# Patient Record
Sex: Female | Born: 1955 | Race: White | Marital: Married
Health system: Southern US, Community
[De-identification: ages and names within clinical notes are randomized; demographics above are authoritative.]

---

## 2013-11-10 DIAGNOSIS — Z90710 Acquired absence of both cervix and uterus: Secondary | ICD-10-CM | POA: Insufficient documentation

## 2014-01-04 DIAGNOSIS — I10 Essential (primary) hypertension: Secondary | ICD-10-CM | POA: Insufficient documentation

## 2014-01-23 DIAGNOSIS — K635 Polyp of colon: Secondary | ICD-10-CM | POA: Insufficient documentation

## 2014-04-11 DIAGNOSIS — F419 Anxiety disorder, unspecified: Secondary | ICD-10-CM | POA: Insufficient documentation

## 2014-04-11 DIAGNOSIS — K649 Unspecified hemorrhoids: Secondary | ICD-10-CM | POA: Insufficient documentation

## 2014-11-06 DIAGNOSIS — K5901 Slow transit constipation: Secondary | ICD-10-CM | POA: Insufficient documentation

## 2014-11-06 DIAGNOSIS — K219 Gastro-esophageal reflux disease without esophagitis: Secondary | ICD-10-CM | POA: Insufficient documentation

## 2015-06-18 DIAGNOSIS — M2550 Pain in unspecified joint: Secondary | ICD-10-CM | POA: Insufficient documentation

## 2015-06-18 DIAGNOSIS — M25469 Effusion, unspecified knee: Secondary | ICD-10-CM | POA: Insufficient documentation

## 2015-06-18 DIAGNOSIS — M25521 Pain in right elbow: Secondary | ICD-10-CM | POA: Insufficient documentation

## 2015-06-18 DIAGNOSIS — R5383 Other fatigue: Secondary | ICD-10-CM | POA: Insufficient documentation

## 2015-06-18 DIAGNOSIS — Z9889 Other specified postprocedural states: Secondary | ICD-10-CM | POA: Insufficient documentation

## 2015-06-18 DIAGNOSIS — M25541 Pain in joints of right hand: Secondary | ICD-10-CM | POA: Insufficient documentation

## 2015-07-02 DIAGNOSIS — M150 Primary generalized (osteo)arthritis: Secondary | ICD-10-CM | POA: Insufficient documentation

## 2015-07-02 DIAGNOSIS — M159 Polyosteoarthritis, unspecified: Secondary | ICD-10-CM | POA: Insufficient documentation

## 2015-07-02 DIAGNOSIS — M810 Age-related osteoporosis without current pathological fracture: Secondary | ICD-10-CM | POA: Insufficient documentation

## 2015-07-02 DIAGNOSIS — M4120 Other idiopathic scoliosis, site unspecified: Secondary | ICD-10-CM | POA: Insufficient documentation

## 2015-11-05 DIAGNOSIS — E785 Hyperlipidemia, unspecified: Secondary | ICD-10-CM | POA: Insufficient documentation

## 2015-12-18 DIAGNOSIS — R197 Diarrhea, unspecified: Secondary | ICD-10-CM | POA: Insufficient documentation

## 2016-01-01 ENCOUNTER — Other Ambulatory Visit: Payer: Self-pay | Admitting: Orthopaedic Surgery

## 2016-01-01 DIAGNOSIS — M545 Low back pain: Principal | ICD-10-CM

## 2016-01-01 DIAGNOSIS — G8929 Other chronic pain: Secondary | ICD-10-CM

## 2016-01-07 ENCOUNTER — Ambulatory Visit
Admission: RE | Admit: 2016-01-07 | Discharge: 2016-01-07 | Disposition: A | Discharge status: Home / Self Care | Payer: Medicare Other | Source: Ambulatory Visit | Attending: Orthopaedic Surgery | Admitting: Orthopaedic Surgery

## 2016-01-07 DIAGNOSIS — G8929 Other chronic pain: Secondary | ICD-10-CM

## 2016-01-07 DIAGNOSIS — M545 Low back pain: Principal | ICD-10-CM

## 2016-01-07 MED ORDER — OXYCODONE-ACETAMINOPHEN 5-325 MG PO TABS
2.0000 | ORAL_TABLET | Freq: Once | ORAL | Status: AC
Start: 2016-01-07 — End: 2016-01-07
  Administered 2016-01-07: 2 via ORAL

## 2016-01-07 MED ORDER — DIAZEPAM 5 MG PO TABS
10.0000 mg | ORAL_TABLET | Freq: Once | ORAL | Status: AC
  Administered 2016-01-07: 10 mg via ORAL

## 2016-01-07 MED ORDER — MEPERIDINE HCL 100 MG/ML IJ SOLN
75.0000 mg | Freq: Once | INTRAMUSCULAR | Status: AC
  Administered 2016-01-07: 75 mg via INTRAMUSCULAR

## 2016-01-07 MED ORDER — IOPAMIDOL (ISOVUE-M 200) INJECTION 41%
15.0000 mL | Freq: Once | INTRAMUSCULAR | Status: AC
  Administered 2016-01-07: 15 mL via INTRATHECAL

## 2016-01-07 MED ORDER — ONDANSETRON HCL 4 MG/2ML IJ SOLN
4.0000 mg | Freq: Once | INTRAMUSCULAR | Status: AC
  Administered 2016-01-07: 4 mg via INTRAMUSCULAR

## 2016-01-07 NOTE — Progress Notes (Signed)
Pt states she has been off Paxil for the past 2 days. 

## 2016-01-07 NOTE — Discharge Instructions (Signed)
Myelogram Discharge Instructions  1. Go home and rest quietly for the next 24 hours.  It is important to lie flat for the next 24 hours.  Get up only to go to the restroom.  You may lie in the bed or on a couch on your back, your stomach, your left side or your right side.  You may have one pillow under your head.  You may have pillows between your knees while you are on your side or under your knees while you are on your back.  2. DO NOT drive today.  Recline the seat as far back as it will go, while still wearing your seat belt, on the way home.  3. You may get up to go to the bathroom as needed.  You may sit up for 10 minutes to eat.  You may resume your normal diet and medications unless otherwise indicated.  Drink lots of extra fluids today and tomorrow.  4. The incidence of headache, nausea, or vomiting is about 5% (one in 20 patients).  If you develop a headache, lie flat and drink plenty of fluids until the headache goes away.  Caffeinated beverages may be helpful.  If you develop severe nausea and vomiting or a headache that does not go away with flat bed rest, call [phone].  5. You may resume normal activities after your 24 hours of bed rest is over; however, do not exert yourself strongly or do any heavy lifting tomorrow. If when you get up you have a headache when standing, go back to bed and force fluids for another 24 hours.  6. Call your physician for a follow-up appointment.  The results of your myelogram will be sent directly to your physician by the following day.  7. If you have any questions or if complications develop after you arrive home, please call [phone].  Discharge instructions have been explained to the patient.  The patient, or the person responsible for the patient, fully understands these instructions.       May resume Paxil on Jan 08, 2016, after 9:30 am.

## 2016-04-27 ENCOUNTER — Other Ambulatory Visit: Payer: Self-pay | Admitting: Orthopaedic Surgery

## 2016-04-27 DIAGNOSIS — M898X1 Other specified disorders of bone, shoulder: Secondary | ICD-10-CM

## 2016-04-29 ENCOUNTER — Other Ambulatory Visit: Payer: Self-pay | Admitting: Orthopaedic Surgery

## 2016-04-29 DIAGNOSIS — M8580 Other specified disorders of bone density and structure, unspecified site: Secondary | ICD-10-CM

## 2016-05-08 ENCOUNTER — Other Ambulatory Visit: Payer: Medicare Other

## 2016-05-13 DIAGNOSIS — N62 Hypertrophy of breast: Secondary | ICD-10-CM | POA: Insufficient documentation

## 2016-05-13 DIAGNOSIS — M545 Low back pain, unspecified: Secondary | ICD-10-CM | POA: Insufficient documentation

## 2016-05-13 DIAGNOSIS — N6489 Other specified disorders of breast: Secondary | ICD-10-CM | POA: Insufficient documentation

## 2016-06-12 DIAGNOSIS — K5732 Diverticulitis of large intestine without perforation or abscess without bleeding: Secondary | ICD-10-CM | POA: Insufficient documentation

## 2016-07-15 DIAGNOSIS — Z79899 Other long term (current) drug therapy: Secondary | ICD-10-CM | POA: Insufficient documentation

## 2016-07-21 DIAGNOSIS — E7800 Pure hypercholesterolemia, unspecified: Secondary | ICD-10-CM | POA: Insufficient documentation

## 2016-07-21 DIAGNOSIS — R32 Unspecified urinary incontinence: Secondary | ICD-10-CM | POA: Insufficient documentation

## 2016-08-18 DIAGNOSIS — E559 Vitamin D deficiency, unspecified: Secondary | ICD-10-CM | POA: Insufficient documentation

## 2016-09-15 DIAGNOSIS — R0981 Nasal congestion: Secondary | ICD-10-CM | POA: Insufficient documentation

## 2017-08-02 DIAGNOSIS — R634 Abnormal weight loss: Secondary | ICD-10-CM | POA: Insufficient documentation

## 2018-08-25 ENCOUNTER — Other Ambulatory Visit: Payer: Self-pay | Admitting: Orthopaedic Surgery

## 2018-08-25 DIAGNOSIS — M1612 Unilateral primary osteoarthritis, left hip: Secondary | ICD-10-CM

## 2018-09-08 ENCOUNTER — Ambulatory Visit
Admission: RE | Admit: 2018-09-08 | Discharge: 2018-09-08 | Disposition: A | Discharge status: Home / Self Care | Payer: Medicare Other | Source: Ambulatory Visit | Attending: Orthopaedic Surgery | Admitting: Orthopaedic Surgery

## 2018-09-08 DIAGNOSIS — M1612 Unilateral primary osteoarthritis, left hip: Secondary | ICD-10-CM

## 2018-12-15 DIAGNOSIS — Z01818 Encounter for other preprocedural examination: Secondary | ICD-10-CM | POA: Insufficient documentation

## 2018-12-15 DIAGNOSIS — M25552 Pain in left hip: Secondary | ICD-10-CM | POA: Insufficient documentation

## 2019-10-24 DIAGNOSIS — B070 Plantar wart: Secondary | ICD-10-CM | POA: Insufficient documentation

## 2019-11-14 IMAGING — MR MR HIP*L* W/O CM
5 of 6 series · 33 of 40 positions shown · non-contrast
Comparison: None.

CLINICAL DATA: Left hip pain for 3 months.  No known injury.

EXAM:
MR OF THE LEFT HIP WITHOUT CONTRAST
TECHNIQUE: Multiplanar, multisequence MR imaging was performed. No intravenous
contrast was administered.

[Series 5: T2 fat-sat · coronal · 4.0mm · 0.74mm/px · 7 of 24 slices shown (1 of 2)]
[im 1/24]
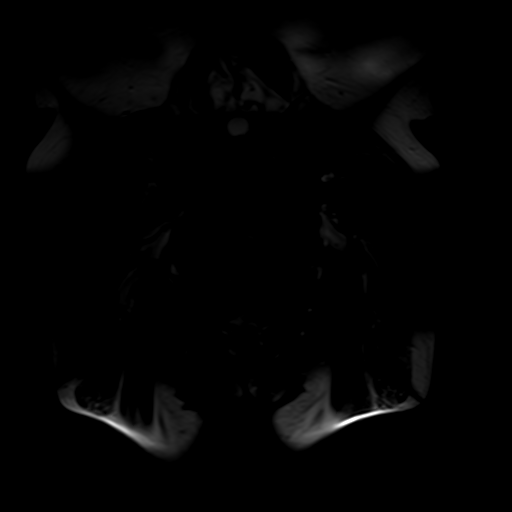
[im 4/24]
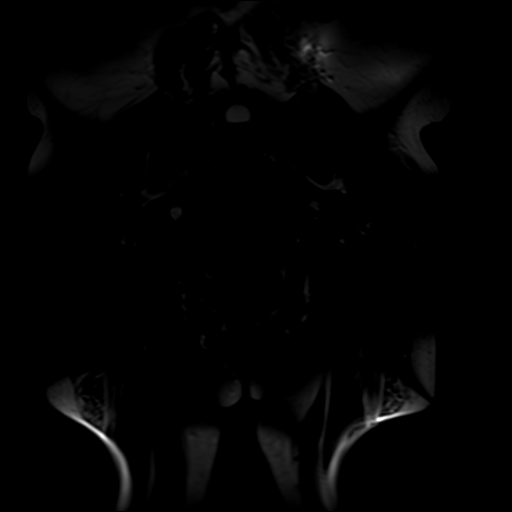
[im 8/24]
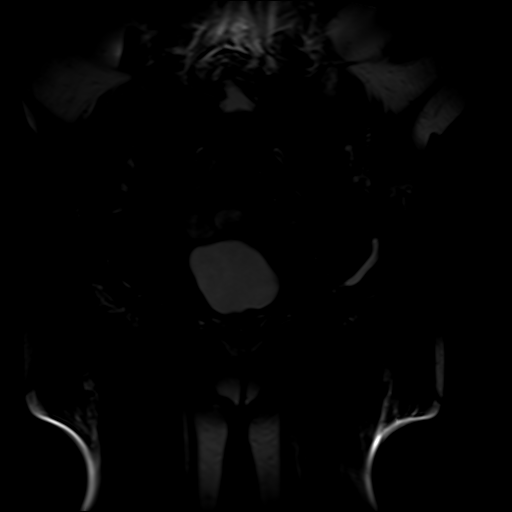
[im 12/24]
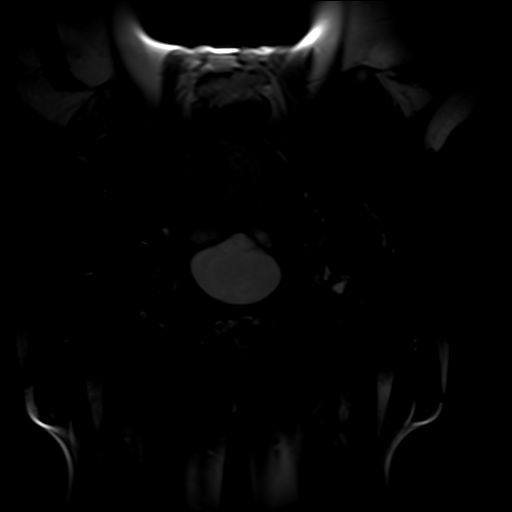
[im 16/24]
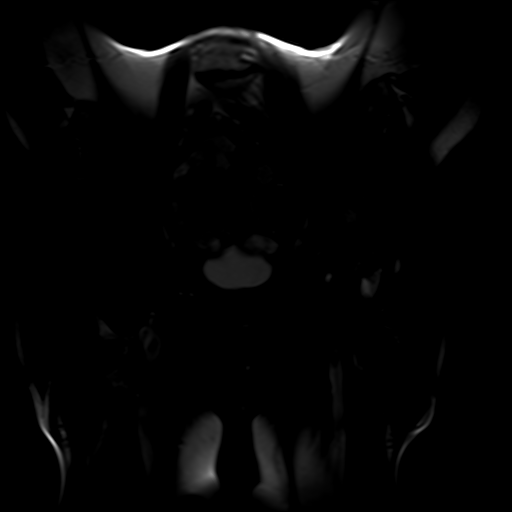
[im 20/24]
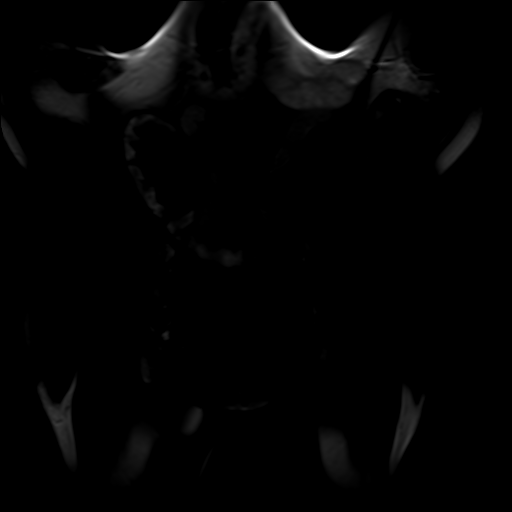
[im 24/24]
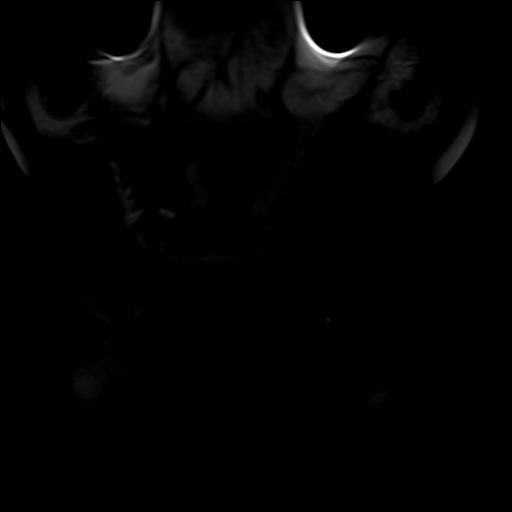

[Series 7: PD fat-sat · sagittal · 4.0mm · 0.70mm/px · 7 of 22 slices shown (1 of 3)]
[im 1/22]
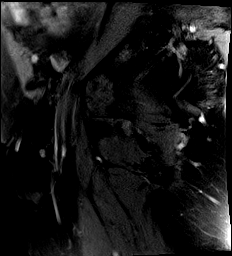
[im 4/22]
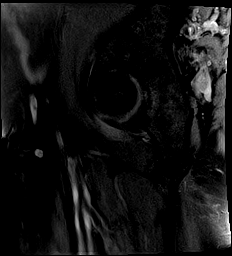
[im 8/22]
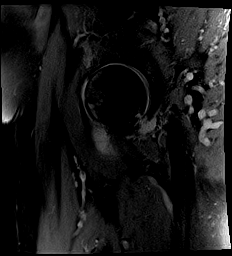
[im 11/22]
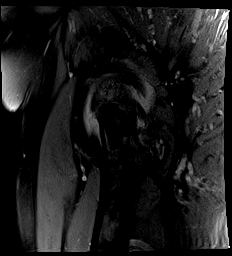
[im 15/22]
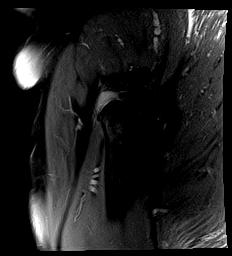
[im 18/22]
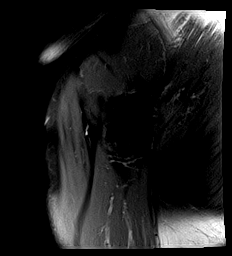
[im 22/22]
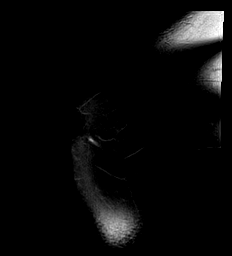

[Series 8: PD fat-sat · coronal · 4.0mm · 0.70mm/px · 6 of 19 slices shown (2 of 3)]
[im 1/19]
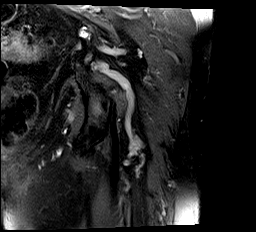
[im 4/19]
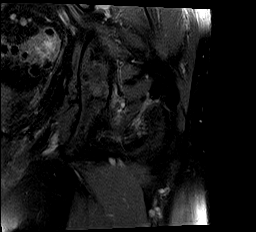
[im 8/19]
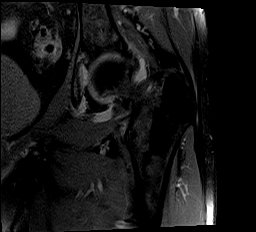
[im 11/19]
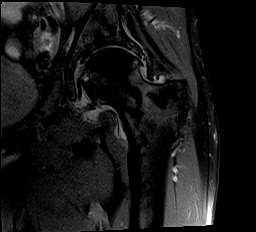
[im 15/19]
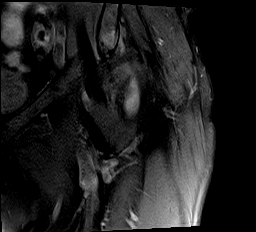
[im 19/19]
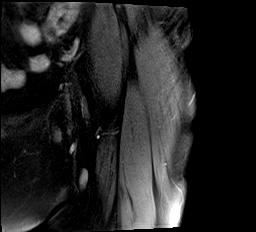

[Series 9: T2 fat-sat · axial · 4.0mm · 0.35mm/px · z∈[-44,+51]mm · 7 of 24 slices shown (2 of 2)]
[im 1/24]
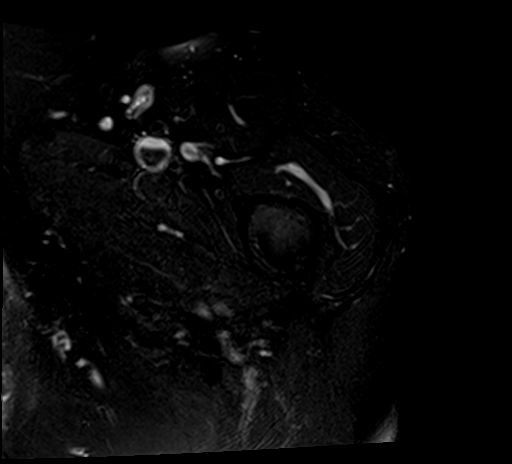
[im 4/24]
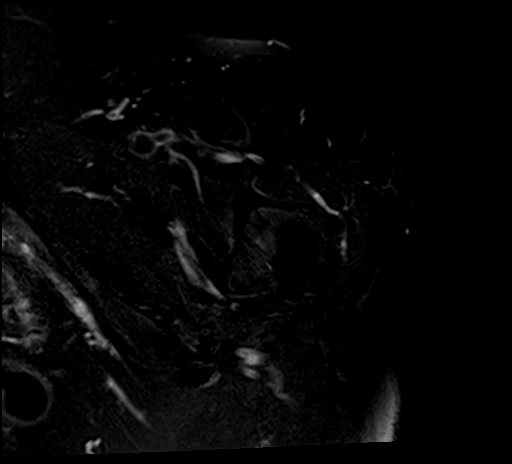
[im 7/24]
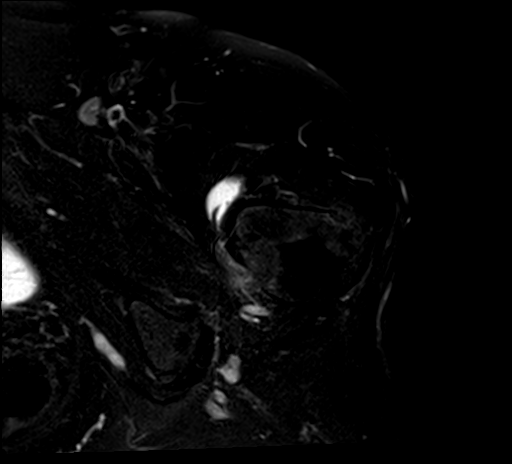
[im 10/24]
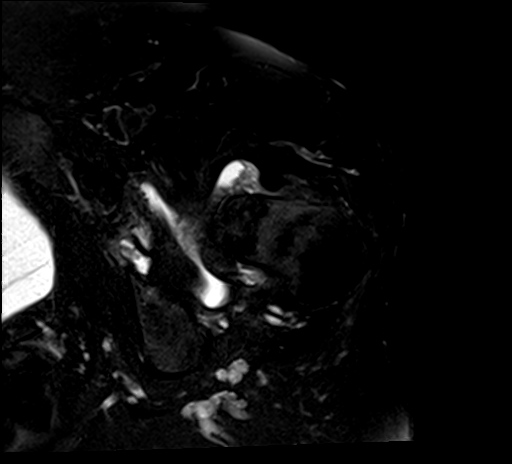
[im 14/24]
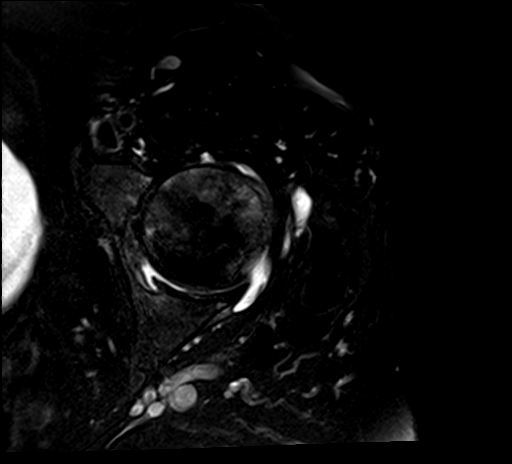
[im 17/24]
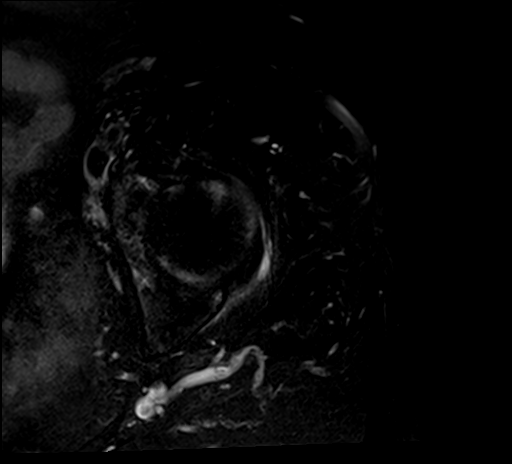
[im 20/24]
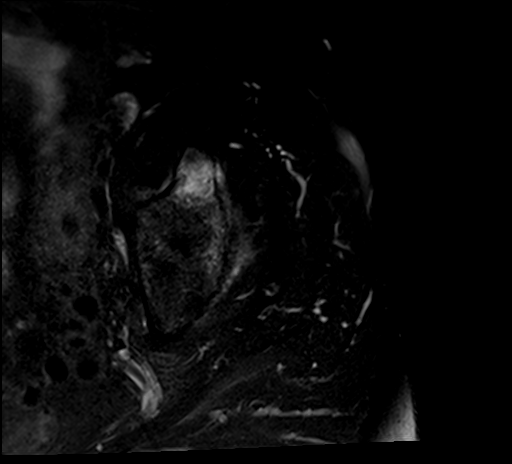

[Series 10: PD fat-sat · axial · 4.0mm · 0.70mm/px · z∈[+27,+107]mm · 6 of 20 slices shown (3 of 3)]
[im 1/20]
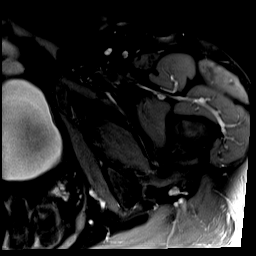
[im 4/20]
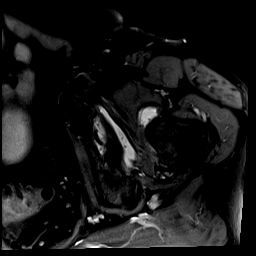
[im 8/20]
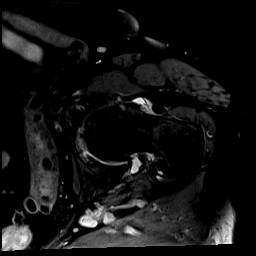
[im 12/20]
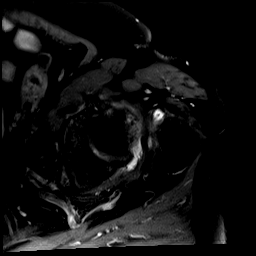
[im 16/20]
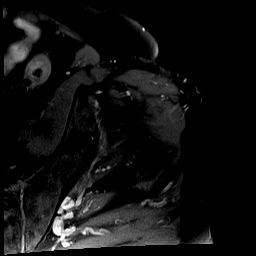
[im 20/20]
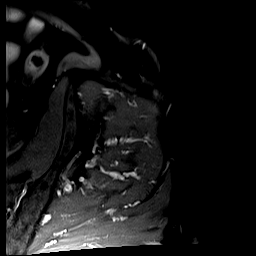

[33 of 40 positions shown; findings below may reference images not displayed]

FINDINGS: Bones: No acute bony or joint abnormality is seen. There is
extensive subchondral edema and osteophytosis about the left hip.
Milder degree of subchondral edema is seen in the right acetabulum.
No avascular necrosis of the femoral heads.

Articular cartilage and labrum

Articular cartilage: Extensive cartilage loss is worst anteriorly
and superiorly where there is bone-on-bone joint space narrowing.

Labrum:  The anterior and superior labrum are degenerated and torn.

Joint or bursal effusion

Joint effusion:  Small to moderate left hip effusion is present.

Bursae: Negative.

Muscles and tendons

Muscles and tendons:  Intact.

Other findings

Miscellaneous: Imaged intrapelvic contents demonstrate sigmoid
diverticulosis. The patient is status post hysterectomy.
IMPRESSION: Severe left and moderate right hip osteoarthritis.

No acute abnormality.

Sigmoid diverticulosis.

## 2020-02-06 ENCOUNTER — Ambulatory Visit: Payer: Medicare PPO | Admitting: Podiatry

## 2020-02-06 ENCOUNTER — Other Ambulatory Visit: Payer: Self-pay

## 2020-02-06 DIAGNOSIS — B070 Plantar wart: Secondary | ICD-10-CM | POA: Diagnosis not present

## 2020-02-19 NOTE — Progress Notes (Signed)
  Subjective:  Patient ID: Gabriela Wagner, female    DOB: 03-21-1956,  MRN: [id number]  Chief Complaint  Patient presents with  . foot lesion    Rt 1st sub met lesion x 4 mo; 5/10 pain -pt deneis injury/redness/sweling/driaange/open Tx: OTC patches    64 y.o. female presents with the above complaint. History confirmed with patient.   Objective:  Physical Exam: warm, good capillary refill, no trophic changes or ulcerative lesions, normal DP and PT pulses and normal sensory exam. Right foot: Hyperkeratotic lesion with disruption of skin lines and petechial bleeding at the first MPJ  Assessment:   1. Verruca plantaris      Plan:  Patient was evaluated and treated and all questions answered.  Verruca -Educated on etiology -Lesion debrided and destroyed -Offloading pad applied  Procedure: Destruction of Lesion Location: right submet 1 Anesthesia: none Instrumentation: 15 blade. Technique: Debridement of lesion to petechial bleeding. Aperture pad applied around lesion. Small amount of canthrone applied to the base of the lesion. Dressing: Dry, sterile, compression dressing. Disposition: Patient tolerated procedure well. Advised to leave dressing on for 6-8 hours. Thereafter patient to wash the area with soap and water and applied band-aid. Off-loading pads dispensed. Patient to return in 2 weeks for follow-up.  No follow-ups on file.

## 2021-04-08 ENCOUNTER — Institutional Professional Consult (permissible substitution): Payer: Self-pay | Admitting: Pulmonary Disease
